# Patient Record
Sex: Female | Born: 1935 | Race: Black or African American | Hispanic: No
Health system: Southern US, Community
[De-identification: ages and names within clinical notes are randomized; demographics above are authoritative.]

---

## 2011-01-31 ENCOUNTER — Inpatient Hospital Stay: Payer: Self-pay | Admitting: Specialist

## 2011-02-13 ENCOUNTER — Ambulatory Visit: Payer: Self-pay | Admitting: Gastroenterology

## 2011-02-14 LAB — PATHOLOGY REPORT

## 2011-11-19 ENCOUNTER — Ambulatory Visit: Payer: Self-pay | Admitting: Rheumatology

## 2012-06-28 ENCOUNTER — Inpatient Hospital Stay: Payer: Self-pay | Admitting: Surgery

## 2012-06-28 LAB — CBC WITH DIFFERENTIAL/PLATELET
Basophil %: 0.2 %
Eosinophil #: 0 10*3/uL (ref 0.0–0.7)
HCT: 39.8 % (ref 35.0–47.0)
Lymphocyte #: 0.9 10*3/uL — ABNORMAL LOW (ref 1.0–3.6)
Lymphocyte %: 8.7 %
MCH: 29.2 pg (ref 26.0–34.0)
MCHC: 32.6 g/dL (ref 32.0–36.0)
Monocyte #: 0.5 x10 3/mm (ref 0.2–0.9)
Monocyte %: 4.5 %
Neutrophil %: 86.5 %
Platelet: 224 10*3/uL (ref 150–440)
RDW: 16.2 % — ABNORMAL HIGH (ref 11.5–14.5)
WBC: 10.3 10*3/uL (ref 3.6–11.0)

## 2012-06-28 LAB — COMPREHENSIVE METABOLIC PANEL
Albumin: 4.1 g/dL (ref 3.4–5.0)
Alkaline Phosphatase: 68 U/L (ref 50–136)
Anion Gap: 13 (ref 7–16)
BUN: 14 mg/dL (ref 7–18)
Bilirubin,Total: 1.1 mg/dL — ABNORMAL HIGH (ref 0.2–1.0)
Chloride: 97 mmol/L — ABNORMAL LOW (ref 98–107)
Creatinine: 0.99 mg/dL (ref 0.60–1.30)
EGFR (Non-African Amer.): 54 — ABNORMAL LOW
Glucose: 142 mg/dL — ABNORMAL HIGH (ref 65–99)
Potassium: 3.3 mmol/L — ABNORMAL LOW (ref 3.5–5.1)
SGOT(AST): 29 U/L (ref 15–37)
SGPT (ALT): 26 U/L
Total Protein: 8.4 g/dL — ABNORMAL HIGH (ref 6.4–8.2)

## 2012-06-28 LAB — URINALYSIS, COMPLETE
Glucose,UR: NEGATIVE mg/dL (ref 0–75)
Nitrite: NEGATIVE
Ph: 7 (ref 4.5–8.0)
Specific Gravity: 1.031 (ref 1.003–1.030)
Squamous Epithelial: NONE SEEN

## 2012-06-28 LAB — LIPASE, BLOOD: Lipase: 92 U/L (ref 73–393)

## 2012-06-29 LAB — COMPREHENSIVE METABOLIC PANEL
Albumin: 3.9 g/dL (ref 3.4–5.0)
Alkaline Phosphatase: 71 U/L (ref 50–136)
Anion Gap: 11 (ref 7–16)
BUN: 12 mg/dL (ref 7–18)
Calcium, Total: 9.3 mg/dL (ref 8.5–10.1)
Creatinine: 0.86 mg/dL (ref 0.60–1.30)
Glucose: 138 mg/dL — ABNORMAL HIGH (ref 65–99)
Potassium: 3.3 mmol/L — ABNORMAL LOW (ref 3.5–5.1)
SGOT(AST): 29 U/L (ref 15–37)
Total Protein: 8.4 g/dL — ABNORMAL HIGH (ref 6.4–8.2)

## 2012-06-29 LAB — CBC WITH DIFFERENTIAL/PLATELET
Basophil #: 0 10*3/uL (ref 0.0–0.1)
Basophil %: 0.3 %
Eosinophil #: 0 10*3/uL (ref 0.0–0.7)
Lymphocyte #: 1.4 10*3/uL (ref 1.0–3.6)
Lymphocyte %: 10.2 %
MCHC: 32.7 g/dL (ref 32.0–36.0)
MCV: 90 fL (ref 80–100)
Monocyte #: 0.7 x10 3/mm (ref 0.2–0.9)
Neutrophil #: 11.7 10*3/uL — ABNORMAL HIGH (ref 1.4–6.5)
Neutrophil %: 83.9 %
Platelet: 230 10*3/uL (ref 150–440)
RBC: 4.65 10*6/uL (ref 3.80–5.20)
RDW: 16.3 % — ABNORMAL HIGH (ref 11.5–14.5)
WBC: 13.9 10*3/uL — ABNORMAL HIGH (ref 3.6–11.0)

## 2012-06-29 LAB — TROPONIN I
Troponin-I: <0.02 ng/mL
Troponin-I: <0.02 ng/mL

## 2012-06-29 LAB — CK TOTAL AND CKMB (NOT AT ARMC)
CK, Total: 99 U/L
CK, Total: 76 U/L (ref 21–215)
CK, Total: 52 U/L (ref 21–215)
CK-MB: 0.9 ng/mL
CK-MB: 3.3 ng/mL (ref 0.5–3.6)

## 2012-06-30 LAB — CBC WITH DIFFERENTIAL/PLATELET
HCT: 39.6 % (ref 35.0–47.0)
Lymphocytes: 13 %
MCH: 30 pg (ref 26.0–34.0)
Monocytes: 8 %
Platelet: 233 10*3/uL (ref 150–440)
RBC: 4.4 10*6/uL (ref 3.80–5.20)
Segmented Neutrophils: 79 %
WBC: 13.2 10*3/uL — ABNORMAL HIGH (ref 3.6–11.0)

## 2012-06-30 LAB — COMPREHENSIVE METABOLIC PANEL
Albumin: 3.1 g/dL — ABNORMAL LOW (ref 3.4–5.0)
Alkaline Phosphatase: 63 U/L (ref 50–136)
BUN: 15 mg/dL (ref 7–18)
Bilirubin,Total: 1 mg/dL (ref 0.2–1.0)
Co2: 27 mmol/L (ref 21–32)
Creatinine: 0.78 mg/dL (ref 0.60–1.30)
EGFR (Non-African Amer.): >60
Osmolality: 278 (ref 275–301)
SGOT(AST): 20 U/L (ref 15–37)
SGPT (ALT): 16 U/L
Sodium: 138 mmol/L (ref 136–145)
Total Protein: 6.9 g/dL (ref 6.4–8.2)

## 2012-06-30 LAB — LIPASE, BLOOD: Lipase: 67 U/L — ABNORMAL LOW (ref 73–393)

## 2012-06-30 LAB — APTT: Activated PTT: 30.7 secs (ref 23.6–35.9)

## 2012-07-01 LAB — BASIC METABOLIC PANEL
Anion Gap: 12 (ref 7–16)
BUN: 13 mg/dL (ref 7–18)
Chloride: 102 mmol/L (ref 98–107)
Co2: 24 mmol/L (ref 21–32)
Creatinine: 0.6 mg/dL (ref 0.60–1.30)
EGFR (African American): >60
Osmolality: 276 (ref 275–301)

## 2012-07-01 LAB — CBC WITH DIFFERENTIAL/PLATELET
Comment - H1-Com1: NORMAL
Comment - H1-Com2: NORMAL
HCT: 38.1 % (ref 35.0–47.0)
HGB: 12.3 g/dL (ref 12.0–16.0)
Lymphocytes: 26 %
MCH: 29.4 pg (ref 26.0–34.0)
MCHC: 32.4 g/dL (ref 32.0–36.0)
MCV: 91 fL (ref 80–100)
Monocytes: 13 %
RDW: 15.9 % — ABNORMAL HIGH (ref 11.5–14.5)

## 2012-07-02 LAB — CBC WITH DIFFERENTIAL/PLATELET
Basophil #: 0 10*3/uL (ref 0.0–0.1)
Comment - H1-Com1: NORMAL
Comment - H1-Com2: NORMAL
Eosinophil #: 0 10*3/uL (ref 0.0–0.7)
Eosinophil %: 0.1 %
Lymphocyte #: 0.6 10*3/uL — ABNORMAL LOW (ref 1.0–3.6)
Lymphocytes: 4 %
MCH: 29.6 pg (ref 26.0–34.0)
MCHC: 32.9 g/dL (ref 32.0–36.0)
MCV: 90 fL (ref 80–100)
Monocyte #: 0.7 x10 3/mm (ref 0.2–0.9)
Monocyte %: 4.6 %
Neutrophil %: 90.9 %
Platelet: 219 10*3/uL (ref 150–440)
RBC: 4.49 10*6/uL (ref 3.80–5.20)
RDW: 16.4 % — ABNORMAL HIGH (ref 11.5–14.5)
Segmented Neutrophils: 89 %
Variant Lymphocyte - H1-Rlymph: 2 %

## 2012-07-02 LAB — BASIC METABOLIC PANEL
BUN: 17 mg/dL (ref 7–18)
Calcium, Total: 9.3 mg/dL (ref 8.5–10.1)
Co2: 23 mmol/L (ref 21–32)
Creatinine: 0.58 mg/dL — ABNORMAL LOW (ref 0.60–1.30)
EGFR (African American): >60
Glucose: 99 mg/dL (ref 65–99)
Potassium: 3.7 mmol/L (ref 3.5–5.1)
Sodium: 140 mmol/L (ref 136–145)

## 2012-07-03 LAB — CBC WITH DIFFERENTIAL/PLATELET
Basophil #: 0 10*3/uL (ref 0.0–0.1)
Basophil %: 0.1 %
Eosinophil #: 0 10*3/uL (ref 0.0–0.7)
Eosinophil %: 0.1 %
HCT: 40 % (ref 35.0–47.0)
HGB: 13.2 g/dL (ref 12.0–16.0)
Lymphocyte #: 0.8 10*3/uL — ABNORMAL LOW (ref 1.0–3.6)
Lymphocyte %: 4 %
MCH: 29.8 pg (ref 26.0–34.0)
MCHC: 32.9 g/dL (ref 32.0–36.0)
MCV: 90 fL (ref 80–100)
Monocyte #: 1.6 x10 3/mm — ABNORMAL HIGH (ref 0.2–0.9)
Neutrophil #: 17.4 10*3/uL — ABNORMAL HIGH (ref 1.4–6.5)
Platelet: 258 10*3/uL (ref 150–440)
WBC: 19.8 10*3/uL — ABNORMAL HIGH (ref 3.6–11.0)

## 2012-07-03 LAB — COMPREHENSIVE METABOLIC PANEL
Albumin: 3.1 g/dL — ABNORMAL LOW (ref 3.4–5.0)
Anion Gap: 16 (ref 7–16)
BUN: 18 mg/dL (ref 7–18)
Chloride: 107 mmol/L (ref 98–107)
Co2: 19 mmol/L — ABNORMAL LOW (ref 21–32)
EGFR (African American): >60
EGFR (Non-African Amer.): >60
Glucose: 112 mg/dL — ABNORMAL HIGH (ref 65–99)
Osmolality: 286 (ref 275–301)
SGOT(AST): 14 U/L — ABNORMAL LOW (ref 15–37)
Total Protein: 6.6 g/dL (ref 6.4–8.2)

## 2012-07-04 LAB — CBC WITH DIFFERENTIAL/PLATELET
HCT: 38.8 % (ref 35.0–47.0)
HGB: 12.5 g/dL (ref 12.0–16.0)
MCH: 29.6 pg (ref 26.0–34.0)
MCHC: 32.3 g/dL (ref 32.0–36.0)
MCV: 92 fL (ref 80–100)
RDW: 17.3 % — ABNORMAL HIGH (ref 11.5–14.5)
Segmented Neutrophils: 65 %

## 2012-07-04 LAB — BASIC METABOLIC PANEL
Anion Gap: 7 (ref 7–16)
BUN: 13 mg/dL (ref 7–18)
Calcium, Total: 8.1 mg/dL — ABNORMAL LOW (ref 8.5–10.1)
Creatinine: 0.81 mg/dL (ref 0.60–1.30)
EGFR (African American): >60
Glucose: 181 mg/dL — ABNORMAL HIGH (ref 65–99)
Osmolality: 292 (ref 275–301)
Potassium: 3.5 mmol/L (ref 3.5–5.1)
Sodium: 144 mmol/L (ref 136–145)

## 2012-07-06 LAB — CBC WITH DIFFERENTIAL/PLATELET
Basophil #: 0 10*3/uL (ref 0.0–0.1)
Eosinophil #: 0.1 10*3/uL (ref 0.0–0.7)
Eosinophil %: 0.3 %
Lymphocyte #: 2.5 10*3/uL (ref 1.0–3.6)
MCHC: 31 g/dL — ABNORMAL LOW (ref 32.0–36.0)
MCV: 90 fL (ref 80–100)
Monocyte #: 1.2 x10 3/mm — ABNORMAL HIGH (ref 0.2–0.9)
Monocyte %: 7 %
Neutrophil #: 13.5 10*3/uL — ABNORMAL HIGH (ref 1.4–6.5)
Neutrophil %: 78.2 %
RBC: 3.66 10*6/uL — ABNORMAL LOW (ref 3.80–5.20)
RDW: 15.7 % — ABNORMAL HIGH (ref 11.5–14.5)

## 2012-07-06 LAB — BASIC METABOLIC PANEL
Calcium, Total: 8.2 mg/dL — ABNORMAL LOW (ref 8.5–10.1)
Chloride: 106 mmol/L (ref 98–107)
Co2: 26 mmol/L (ref 21–32)
Creatinine: 0.66 mg/dL (ref 0.60–1.30)
EGFR (African American): >60
EGFR (Non-African Amer.): >60
Potassium: 3 mmol/L — ABNORMAL LOW (ref 3.5–5.1)
Sodium: 144 mmol/L (ref 136–145)

## 2012-07-07 LAB — BASIC METABOLIC PANEL
Anion Gap: 7 (ref 7–16)
BUN: 5 mg/dL — ABNORMAL LOW (ref 7–18)
Chloride: 103 mmol/L (ref 98–107)
Co2: 28 mmol/L (ref 21–32)
EGFR (African American): >60
Glucose: 165 mg/dL — ABNORMAL HIGH (ref 65–99)
Osmolality: 277 (ref 275–301)
Potassium: 3.6 mmol/L (ref 3.5–5.1)

## 2012-07-07 LAB — CBC WITH DIFFERENTIAL/PLATELET
Basophil #: 0.1 10*3/uL (ref 0.0–0.1)
Basophil %: 0.5 %
Eosinophil #: 0.1 10*3/uL (ref 0.0–0.7)
HCT: 35.2 % (ref 35.0–47.0)
HGB: 11.5 g/dL — ABNORMAL LOW (ref 12.0–16.0)
Lymphocyte #: 2.5 10*3/uL (ref 1.0–3.6)
MCH: 29.2 pg (ref 26.0–34.0)
MCHC: 32.7 g/dL (ref 32.0–36.0)
MCV: 90 fL (ref 80–100)
Monocyte %: 8.7 %
Neutrophil #: 11.4 10*3/uL — ABNORMAL HIGH (ref 1.4–6.5)
RBC: 3.93 10*6/uL (ref 3.80–5.20)
RDW: 15.9 % — ABNORMAL HIGH (ref 11.5–14.5)
WBC: 15.5 10*3/uL — ABNORMAL HIGH (ref 3.6–11.0)

## 2012-07-07 LAB — POTASSIUM: Potassium: 3 mmol/L — ABNORMAL LOW (ref 3.5–5.1)

## 2012-07-08 LAB — CBC WITH DIFFERENTIAL/PLATELET
Basophil %: 0.7 %
Eosinophil %: 0.8 %
HCT: 34 % — ABNORMAL LOW (ref 35.0–47.0)
Lymphocyte #: 2.7 10*3/uL (ref 1.0–3.6)
Lymphocyte %: 18 %
MCHC: 32.6 g/dL (ref 32.0–36.0)
MCV: 90 fL (ref 80–100)
Monocyte #: 1.6 x10 3/mm — ABNORMAL HIGH (ref 0.2–0.9)
Monocyte %: 10.4 %
Neutrophil #: 10.7 10*3/uL — ABNORMAL HIGH (ref 1.4–6.5)
Platelet: 251 10*3/uL (ref 150–440)
RBC: 3.77 10*6/uL — ABNORMAL LOW (ref 3.80–5.20)
WBC: 15.2 10*3/uL — ABNORMAL HIGH (ref 3.6–11.0)

## 2012-07-08 LAB — BASIC METABOLIC PANEL
BUN: 4 mg/dL — ABNORMAL LOW (ref 7–18)
Calcium, Total: 8.5 mg/dL (ref 8.5–10.1)
Co2: 27 mmol/L (ref 21–32)
EGFR (African American): >60
EGFR (Non-African Amer.): >60
Glucose: 113 mg/dL — ABNORMAL HIGH (ref 65–99)
Osmolality: 277 (ref 275–301)
Potassium: 3.2 mmol/L — ABNORMAL LOW (ref 3.5–5.1)

## 2012-07-08 LAB — PATHOLOGY REPORT

## 2012-07-09 LAB — BASIC METABOLIC PANEL
Anion Gap: 8 (ref 7–16)
BUN: 2 mg/dL — ABNORMAL LOW (ref 7–18)
Calcium, Total: 8.7 mg/dL (ref 8.5–10.1)
Chloride: 105 mmol/L (ref 98–107)
EGFR (Non-African Amer.): >60
Glucose: 118 mg/dL — ABNORMAL HIGH (ref 65–99)
Osmolality: 275 (ref 275–301)
Potassium: 3.8 mmol/L (ref 3.5–5.1)
Sodium: 139 mmol/L (ref 136–145)

## 2012-07-09 LAB — CBC WITH DIFFERENTIAL/PLATELET
Eosinophil %: 1 %
HCT: 32.5 % — ABNORMAL LOW (ref 35.0–47.0)
Lymphocyte #: 3.2 10*3/uL (ref 1.0–3.6)
MCH: 28.7 pg (ref 26.0–34.0)
MCHC: 31.8 g/dL — ABNORMAL LOW (ref 32.0–36.0)
MCV: 91 fL (ref 80–100)
Monocyte #: 1.4 x10 3/mm — ABNORMAL HIGH (ref 0.2–0.9)
Monocyte %: 9 %
Neutrophil #: 10.6 10*3/uL — ABNORMAL HIGH (ref 1.4–6.5)
Platelet: 276 10*3/uL (ref 150–440)
RBC: 3.59 10*6/uL — ABNORMAL LOW (ref 3.80–5.20)

## 2012-07-23 ENCOUNTER — Inpatient Hospital Stay: Payer: Self-pay | Admitting: Family Medicine

## 2012-07-23 LAB — CBC WITH DIFFERENTIAL/PLATELET
Eosinophil #: 0 10*3/uL (ref 0.0–0.7)
Eosinophil %: 0.4 %
HGB: 10.9 g/dL — ABNORMAL LOW (ref 12.0–16.0)
Lymphocyte #: 1 10*3/uL (ref 1.0–3.6)
Lymphocyte %: 13.9 %
MCH: 30.4 pg (ref 26.0–34.0)
MCV: 91 fL (ref 80–100)
Monocyte #: 0.6 x10 3/mm (ref 0.2–0.9)
Monocyte %: 9.3 %
Neutrophil %: 76 %
Platelet: 205 10*3/uL (ref 150–440)
RBC: 3.6 10*6/uL — ABNORMAL LOW (ref 3.80–5.20)
RDW: 16.7 % — ABNORMAL HIGH (ref 11.5–14.5)
WBC: 6.9 10*3/uL (ref 3.6–11.0)

## 2012-07-23 LAB — URINALYSIS, COMPLETE
Bacteria: NONE SEEN
Bilirubin,UR: NEGATIVE
Glucose,UR: NEGATIVE mg/dL (ref 0–75)
Leukocyte Esterase: NEGATIVE
Nitrite: NEGATIVE
RBC,UR: 1 /HPF (ref 0–5)
Specific Gravity: 1.018 (ref 1.003–1.030)
Transitional Epi: <1
WBC UR: 1 /HPF (ref 0–5)

## 2012-07-23 LAB — COMPREHENSIVE METABOLIC PANEL
Alkaline Phosphatase: 58 U/L (ref 50–136)
Calcium, Total: 8.8 mg/dL (ref 8.5–10.1)
Chloride: 108 mmol/L — ABNORMAL HIGH (ref 98–107)
Co2: 20 mmol/L — ABNORMAL LOW (ref 21–32)
Creatinine: 1.05 mg/dL (ref 0.60–1.30)
EGFR (African American): 58 — ABNORMAL LOW
EGFR (Non-African Amer.): 50 — ABNORMAL LOW
Glucose: 125 mg/dL — ABNORMAL HIGH (ref 65–99)
Osmolality: 281 (ref 275–301)
SGOT(AST): 17 U/L (ref 15–37)
SGPT (ALT): 11 U/L — ABNORMAL LOW (ref 12–78)
Sodium: 138 mmol/L (ref 136–145)

## 2012-07-23 LAB — TROPONIN I: Troponin-I: <0.02 ng/mL

## 2012-07-24 LAB — CBC WITH DIFFERENTIAL/PLATELET
Basophil #: 0.1 10*3/uL (ref 0.0–0.1)
Basophil %: 0.6 %
Eosinophil #: 0.1 10*3/uL (ref 0.0–0.7)
Eosinophil %: 0.8 %
HCT: 29.9 % — ABNORMAL LOW (ref 35.0–47.0)
Lymphocyte %: 24.9 %
MCHC: 34.8 g/dL (ref 32.0–36.0)
Monocyte %: 10.9 %
Neutrophil #: 5.6 10*3/uL (ref 1.4–6.5)

## 2012-07-24 LAB — BASIC METABOLIC PANEL
Calcium, Total: 8.6 mg/dL (ref 8.5–10.1)
Co2: 20 mmol/L — ABNORMAL LOW (ref 21–32)
EGFR (African American): >60
Osmolality: 276 (ref 275–301)
Potassium: 3.5 mmol/L (ref 3.5–5.1)
Sodium: 139 mmol/L (ref 136–145)

## 2012-07-25 LAB — CBC WITH DIFFERENTIAL/PLATELET
Basophil %: 0.6 %
Eosinophil #: 0.1 10*3/uL (ref 0.0–0.7)
Eosinophil %: 1.9 %
HCT: 29.1 % — ABNORMAL LOW (ref 35.0–47.0)
HGB: 9.8 g/dL — ABNORMAL LOW (ref 12.0–16.0)
Lymphocyte #: 2 10*3/uL (ref 1.0–3.6)
Lymphocyte %: 35.6 %
MCH: 31.1 pg (ref 26.0–34.0)
MCHC: 33.9 g/dL (ref 32.0–36.0)
MCV: 92 fL (ref 80–100)
Monocyte #: 0.5 x10 3/mm (ref 0.2–0.9)
Neutrophil #: 3.1 10*3/uL (ref 1.4–6.5)
RBC: 3.16 10*6/uL — ABNORMAL LOW (ref 3.80–5.20)
WBC: 5.7 10*3/uL (ref 3.6–11.0)

## 2012-07-25 LAB — BASIC METABOLIC PANEL
Calcium, Total: 8.5 mg/dL (ref 8.5–10.1)
Chloride: 112 mmol/L — ABNORMAL HIGH (ref 98–107)
Co2: 21 mmol/L (ref 21–32)
Osmolality: 281 (ref 275–301)
Potassium: 3.1 mmol/L — ABNORMAL LOW (ref 3.5–5.1)

## 2012-07-29 LAB — CULTURE, BLOOD (SINGLE)

## 2013-05-09 IMAGING — CR DG ABDOMEN 2V
1 series · 4 of 4 positions shown · non-contrast
Comparison: none

REASON FOR EXAM: f/u distal psbo vs constipation
COMMENTS:

PROCEDURE:     DXR - DXR ABDOMEN 2 V FLAT AND ERECT  - July 02, 2012  [DATE]
RESULT:     Comparisons:  06/30/2012

[Series 1: x abdomen supine · 0.14mm/px · 4 of 4 slices shown]
[im 1/4]
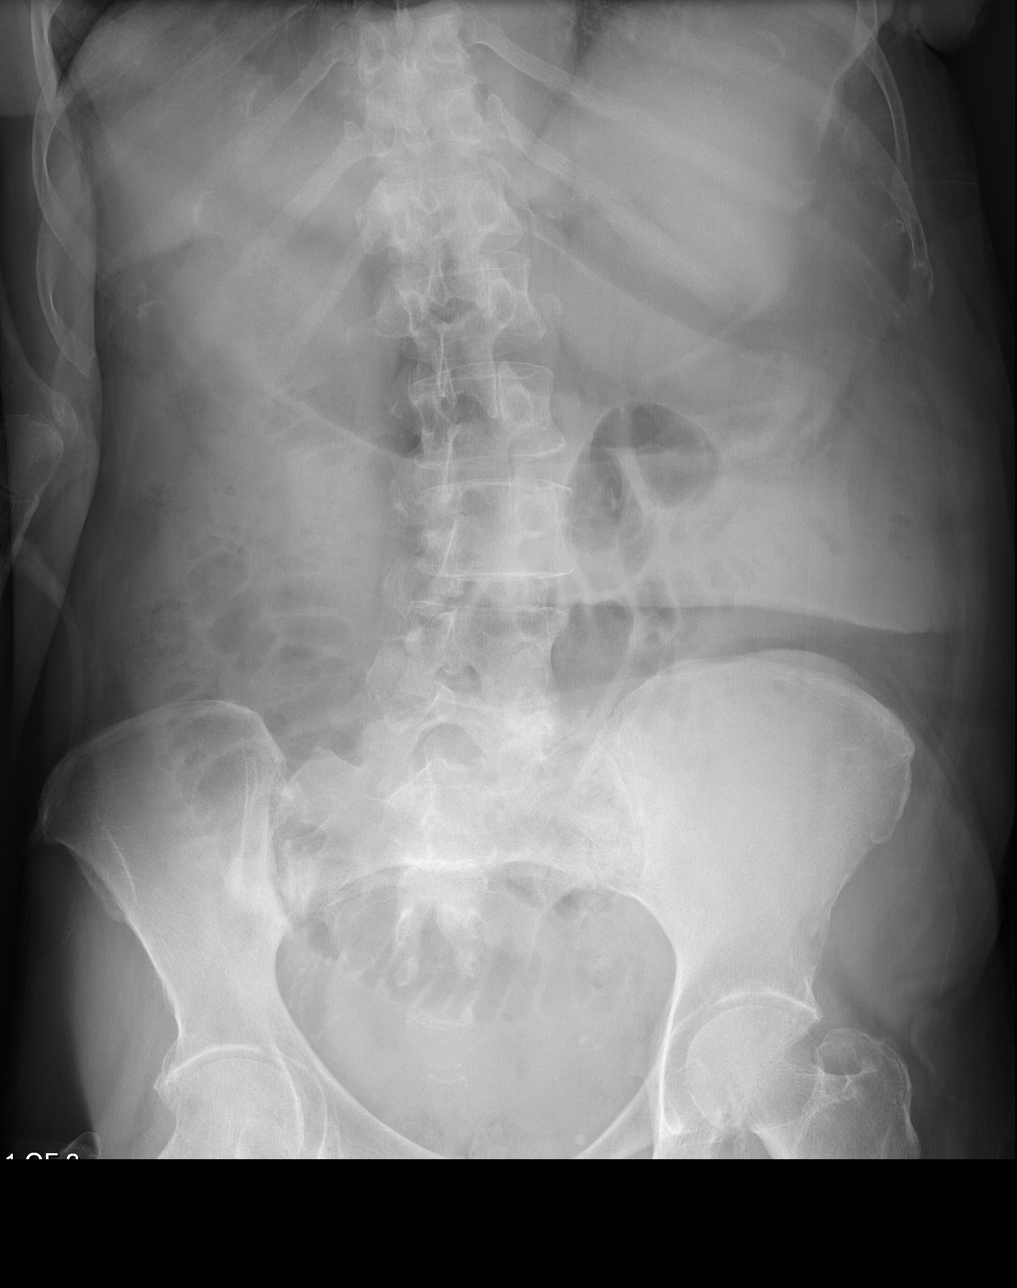
[im 2/4]
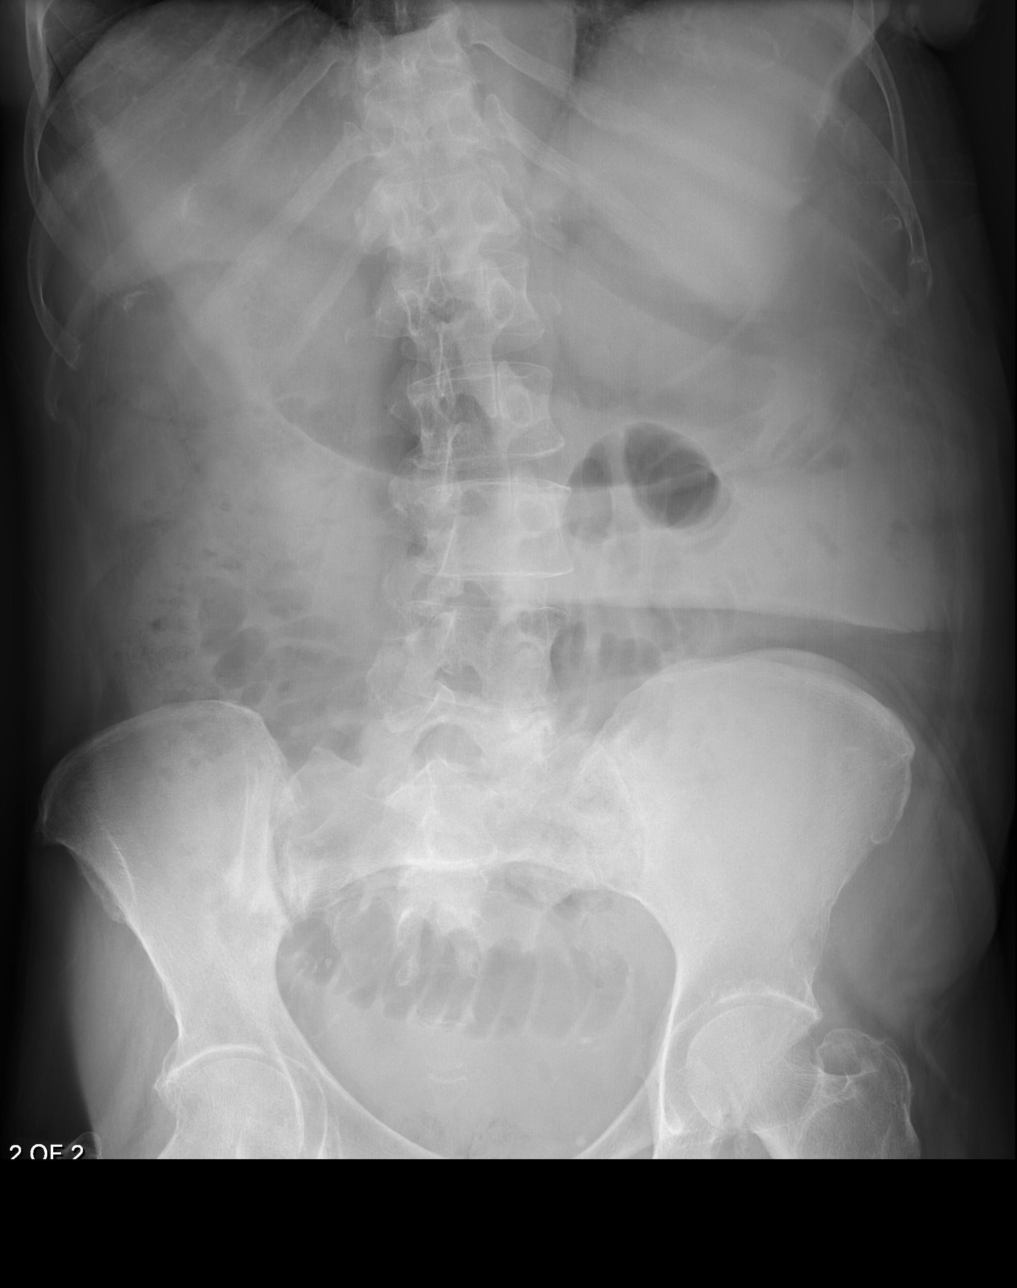
[im 3/4]
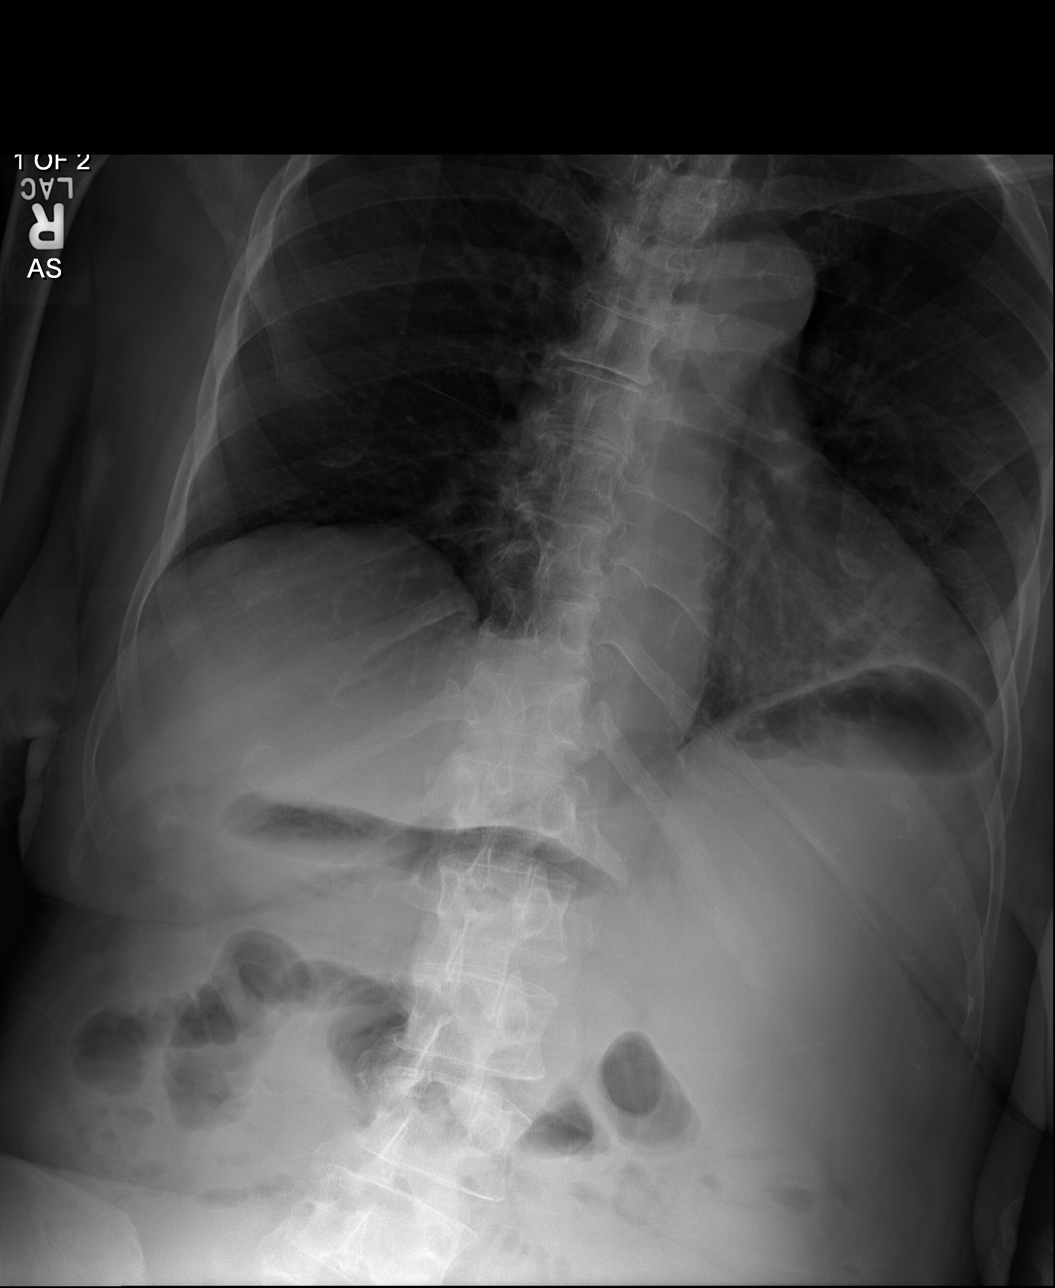
[im 4/4]
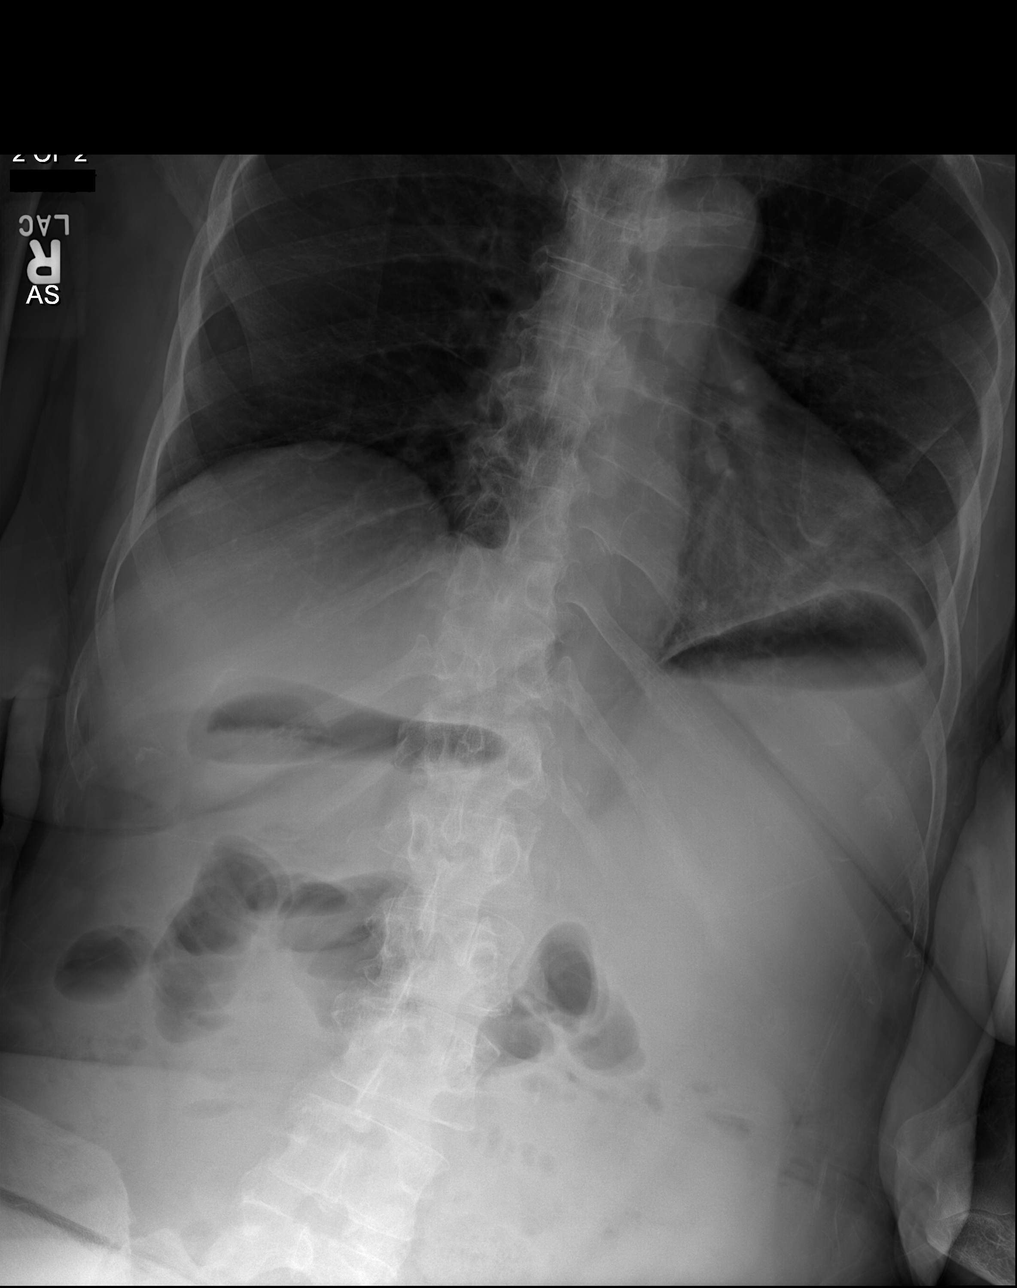

[4 of 4 positions shown; findings below may reference images not displayed]

FINDINGS: Supine and upright views of the abdomen are provided.

There is a nonspecific bowel gas pattern. There is no bowel dilatation to
suggest obstruction. There are a few small bowel air-fluid levels. There is
a gastric air fluid level. There is air seen within the colon. There is no
pathologic calcification along the expected course of the ureters. There is
no evidence of pneumoperitoneum, portal venous gas, or pneumatosis.

The osseous structures are unremarkable.
IMPRESSION: Findings which may reflect partial small bowel obstruction versus ileus.

[REDACTED]

## 2013-05-14 IMAGING — XA IR VASCULAR PROCEDURE
1 series · 1 of 1 positions shown · non-contrast
Comparison: none

[Series 1: single · 1 of 1 slices shown]
[im 1/1]
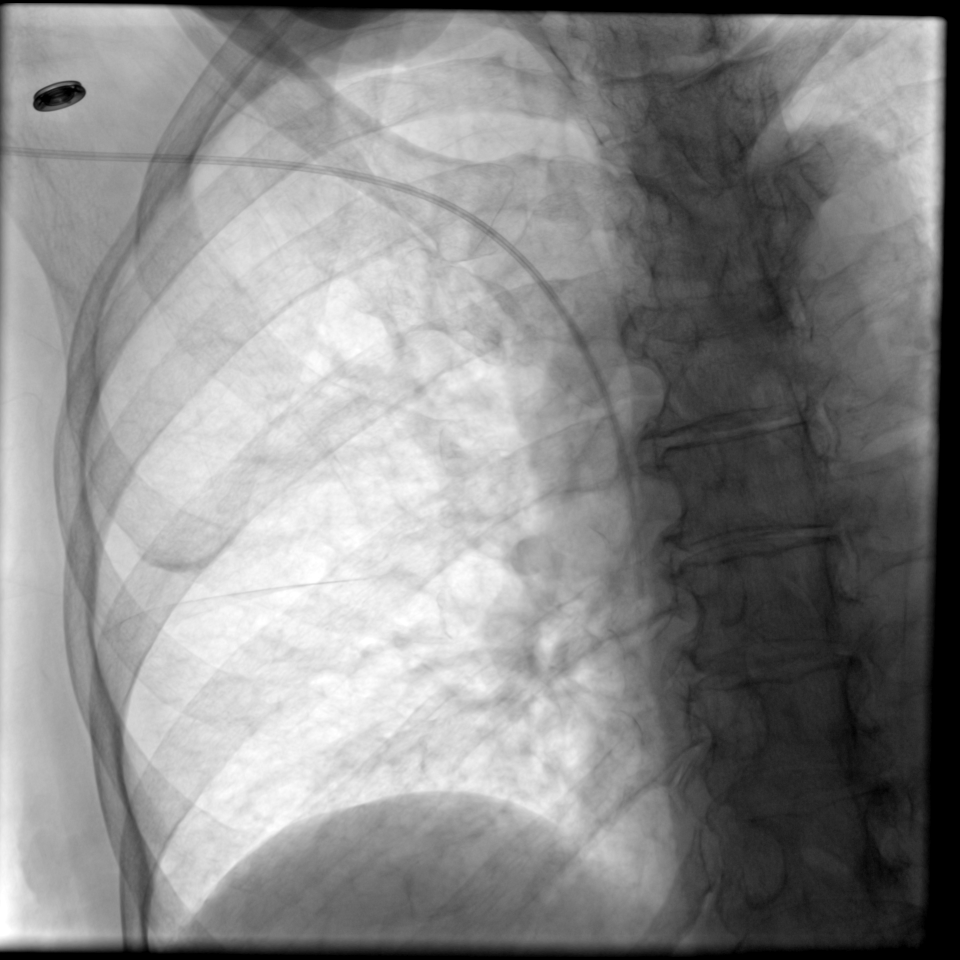

[1 of 1 positions shown; findings below may reference images not displayed]

IMAGES IMPORTED FROM THE [employer] WORKFLOW SYSTEM
NO DICTATION FOR STUDY

## 2015-04-01 ENCOUNTER — Other Ambulatory Visit: Payer: Self-pay | Admitting: Geriatric Medicine

## 2015-04-01 DIAGNOSIS — Z1231 Encounter for screening mammogram for malignant neoplasm of breast: Secondary | ICD-10-CM

## 2015-04-05 NOTE — Discharge Summary (Signed)
PATIENT NAME:  Sherri Stevens, Sherri Stevens MR#:  [id number] DATE OF BIRTH:  Feb 23, 1930  DATE OF ADMISSION:  07/23/2012 DATE OF DISCHARGE:  07/25/2012   REASON FOR ADMISSION: Abdominal pain, constipation.   DISCHARGE DIAGNOSIS: Obstipation due to narcotics.  OTHER DIAGNOSES:  1. Hypertension. 2. Diabetes. 3. Prior history of diverticulosis.   4. Seizure disorder.  5. Gastroesophageal reflux disease.   6. Rheumatoid arthritis.   FOLLOW-UP: Follow-up with primary care physician, Dr. Jorene Guestomstock, and Dr. Juliann PulseLundquist or Dr. Anda KraftMarterre for surgery in 1 to 2 weeks.   HOSPITAL COURSE: [name] is a very nice 81 year old female who has history of hypertension and rheumatoid arthritis. She is status post abdominal surgery with recent bowel resection. The patient has been doing okay but with occasional pain. She had a CT scan showing evidence of fecal impaction. The patient was admitted with concern of diverticulitis. She was put on antibiotics that were stopped. We didn't feel like she had diverticulitis. The case was discussed with Dr. Juliann PulseLundquist from Surgery who felt the same way. The patient was given enemas and her symptoms resolved completely by the time of discharge. I spent 35 minutes with this discharge speaking with the family about the plan on how to control her nausea, pain, and constipation. We also talked about the use of Norco. She needs to avoided it in order to prevent the constipation to get worse. The patient is going to be discharged on home health.  MEDICATIONS AT DISCHARGE:  1. Norco. The patient is warned not to take it unless pain is severe.   2. Atorvastatin 10 mg daily. 3. Cholecalciferol daily. 4. Colace has been changed to senna and is going to be given twice daily. 5. Folic acid one twice daily. 6. Hydrochlorothiazide 12.5 mg once daily. 7. Keppra 500 mg twice daily. 8. Metformin 500 mg twice daily. 9. Methotrexate 2.5 mg tablets 8 tablets twice weekly. 10. Norvasc 5 mg  daily. 11. Omeprazole 20 mg daily. 12. Quinapril 20 mg daily. 13. Prednisone 5 mg daily. 14. KCL 10 mEq once daily.   The patient is to be discharged with home health in good condition and to follow-up with the physicians mentioned above.   TIME SPENT ON THIS DISCHARGE: 35 minutes.   ____________________________ Felipa Furnaceoberto Sanchez Gutierrez, MD rsg:drc D: 07/25/2012 12:33:19 ET T: 07/25/2012 12:51:25 ET JOB#: [id number]  cc: Felipa Furnaceoberto Sanchez Gutierrez, MD, <Dictator> Abbey ChattersLloyd K. Jorene Guestomstock, MD Regan RakersOBERTO Juanda ChanceSANCHEZ GUTIERRE MD ELECTRONICALLY SIGNED 08/02/2012 13:19

## 2015-04-05 NOTE — Op Note (Signed)
PATIENT NAME:  Sherri Stevens, Clelia V MR#:  [id number] DATE OF BIRTH:  05/08/1930  DATE OF PROCEDURE:  07/07/2012  PREOPERATIVE DIAGNOSES:  1. Small bowel obstruction.  2. Aspiration pneumonia.  3. Poor venous access and need for IV antibiotics and fluid.   POSTOPERATIVE DIAGNOSES:  1. Small bowel obstruction.  2. Aspiration pneumonia.  3. Poor venous access and need for IV antibiotics and fluid.   PROCEDURES:  1. Ultrasound guidance for vascular access to right brachial vein.  2. Fluoroscopic guidance for placement of catheter.  3. Insertion of peripherally inserted dual lumen, central venous catheter, right arm.  SURGEON: Annice NeedyJason S. Eliyahu Bille, MD  ANESTHESIA: Local.   ESTIMATED BLOOD LOSS: Minimal.   INDICATION FOR PROCEDURE: 81 year old white female with small bowel obstruction, postoperative pneumonia. She has poor venous access and a PICC line is requested.   DESCRIPTION OF PROCEDURE: The patient's right arm was sterilely prepped and draped, and a sterile surgical field was created. The right basilic vein was accessed under direct ultrasound guidance without difficulty with a micropuncture needle and permanent image was recorded. 0.018 wire was then placed into the superior vena cava. Peel-away sheath was placed over the wire. A single lumen peripherally inserted central venous catheter was then placed over the wire and the wire and peel-away sheath were removed. The catheter tip was placed into the superior vena cava and was secured at the skin at 34 cm with a sterile dressing. The catheter withdrew blood well and flushed easily with heparinized saline.    The patient tolerated procedure well.  ____________________________ Annice NeedyJason S. Thien Berka, MD jsd:cms D: 07/07/2012 11:59:16 ET T: 07/07/2012 12:29:52 ET JOB#: [id number]  cc: Annice NeedyJason S. Sebastien Jackson, MD, <Dictator> Annice NeedyJASON S Priscille Shadduck MD ELECTRONICALLY SIGNED 07/07/2012 16:20

## 2015-04-05 NOTE — Op Note (Signed)
PATIENT NAME:  Sherri Stevens, Sherri Stevens MR#:  [id number] DATE OF BIRTH:  December 17, 1930  DATE OF PROCEDURE:  07/03/2012  PREOPERATIVE DIAGNOSIS: Small bowel obstruction and ventral hernias.   POSTOPERATIVE DIAGNOSIS: Adhesive small bowel obstruction with focal perforation and ventral hernias.   OPERATION PERFORMED:  1. Segmental ileectomy with primary anastomosis.  2. Small bowel suture repair.  3. Omentoplasty.  4. Ventral hernia repairs.  5. Enterolysis.   SURGEON: Claude Manges, MD   ANESTHESIA: General.   PROCEDURE IN DETAIL: The patient was placed supine on the Operating Room table and prepped and draped in the usual sterile fashion. The patient had two lower midline incisions. The one that was closest to the midline was reopened with a scalpel and electrocautery, and two hernia sacs were encountered and both were dissected free of surrounding subcutaneous tissue down to their fascial base. The fascia was then entered or opened, and the peritoneum was entered carefully, and both hernia sacs were completely excised. The upper hernia sac contained a loop of transverse colon that was nonobstructing (both by the admission CT scan as well as intraoperatively), and the inferior ventral hernia contained preperitoneal fat and omentum. An extensive enterolysis was undertaken in order to free up the omentum on both sides of the abdomen as it was apparent that the omentum had been previously split during one of the previous operations leaving the right third of it to the right side of the abdomen, and the left two thirds of it to the left side of the abdomen, and no omentum in the middle of the abdomen. Once all of the omental adhesions were taken down, inspection was undertaken of the small intestine; and the proximal small intestine was mildly dilated and injected but clearly viable. The wall was not particularly edematous. There was a single adhesive band that went from the small bowel mesentery to the  retroperitoneum, and this was lysed; and when it was lysed there was an obvious very focal perforation just under adhesion that had  not spilled any small bowel content until I lysed the adhesion. The contents were essentially bilious and had not fecalized, and spillage was minimal. I resected an inch or two of small bowel on either side of this where this adhesive band had been with two separate firings of the GIA stapling device and taking down the small bowel mesentery with the Harmonic scalpel. A primary anastomosis or side-to-side enteroenterostomy was then performed with the GIA stapling device on the antimesenteric surface of each limb of the intestine with the resultant enterotomy being closed with a TA-60 stapler. A 3-0 silk seromuscular Lembert suture was placed at the apex of the GIA staple line, and a 3-0 Monocryl running suture was used to close the mesenteric defect. The peritoneum was then irrigated with copious amounts of warm normal saline and the distal decompressed ileum was then carefully inspected to insure that there were no other adhesions that would cause a further obstruction;  and then the two chronically split leaves of omentum were sewn back to one another with a running 3-0 Monocryl suture so that after the small intestine was replaced in its anatomic position with the ileum in the right lower quadrant and the jejunum in the left upper quadrant the omentum could be draped over top of it to prevent any of the loops of intestine from adhering to the anterior abdominal wall. The ventral hernias were repaired with interrupted #1 Prolene sutures, and approximately three of these were placed, and  then the linea alba was closed with a running #1 PDS suture. The subcutaneous tissue was irrigated with warm normal saline and all suctioned clear, and the skin was reapproximated with a skin stapling device. It should be mentioned that there was a single cautery injury to the small intestine, and  this was repaired with 3-0 silk seromuscular Lembert sutures just after the injury occurred. A sterile dressing was applied completing the procedure, and the patient tolerated the procedure well and there were no complications.    ____________________________ Claude MangesWilliam F. Yasmina Chico, MD wfm:cbb D: 07/03/2012 15:04:14 ET T: 07/03/2012 15:53:21 ET JOB#: [id number]  cc: Claude MangesWilliam F. Sherri Blouch, MD, <Dictator> Claude MangesWILLIAM F Keiden Deskin MD ELECTRONICALLY SIGNED 07/12/2012 7:08

## 2015-04-05 NOTE — Discharge Summary (Signed)
PATIENT NAME:  [name], [name] MR#:  [id number] DATE OF BIRTH:  April 05, 1930  DATE OF ADMISSION:  06/28/2012 DATE OF DISCHARGE:  07/09/2012  PRINCIPLE DIAGNOSIS: Adhesive small bowel obstruction with focal perforation.   OTHER DIAGNOSES:  1. Ventral hernia.  2. Diabetes mellitus. 3. Rheumatoid arthritis. 4. Hypertension.  5. History of cerebrovascular accident.  6. Status post sigmoid colon resection for diverticulitis. 7. Status post total abdominal hysterectomy.   PRINCIPLE PROCEDURES PERFORMED DURING THIS ADMISSION: Segmental ileectomy with primary anastomosis, small bowel suture repair, omentoplasty, ventral hernia repairs, enterolysis 07/03/2012.   HOSPITAL COURSE: [name] was admitted to the hospital and underwent nasogastric suction, IV fluid hydration, and had multiple enemas and cathartics and had multiple bowel movements but despite that continued to have abdominal distention and after her nasogastric tube was removed she vomited some bile. She was tachycardic and, therefore, was given IV antibiotics and taken to the operating room where she underwent the above-mentioned procedure for the above-mentioned diagnoses. She remained with a nasogastric tube postoperatively until postoperative day three when she was transferred from the Intensive Care Unit to the floor and she had a PICC line placed on postoperative day four and her diet was advanced and her potassium was replaced for hypokalemia. Ultimately she was discharged home a couple of days later. She was asked to make an appointment to see me in the office and to call in the interim for any problems.   ____________________________ Claude MangesWilliam F. Tramar Brueckner, MD wfm:drc D: 07/30/2012 10:12:00 ET T: 07/31/2012 08:58:24 ET JOB#: [id number] cc: Claude MangesWilliam F. Tessia Kassin, MD, <Dictator> Claude MangesWILLIAM F Gethsemane Fischler MD ELECTRONICALLY SIGNED 08/01/2012 21:07

## 2015-04-05 NOTE — Consult Note (Signed)
PATIENT NAME:  Sherri Stevens, Sherri Stevens MR#:  [id number] DATE OF BIRTH:  11-24-1930  DATE OF CONSULTATION:  07/24/2012  REFERRING PHYSICIAN:   CONSULTING PHYSICIAN:  Trivia Heffelfinger A. Lawerence Dery, MD  REASON FOR CONSULTATION: One episode of abdominal pain and constipation.   HISTORY OF PRESENT ILLNESS: [name] is a pleasant 81 year old female who was recently admitted to our service in July and underwent a small bowel resection and hernia repair for incarcerated hernia. She was recently discharged. She presented yesterday with what sounds like a vagal episode. It sounds like she got an enema, was having a bowel movement, got lightheaded and became dusky and almost passed out.  She does come in with persistent abdominal pain and intermittent nausea, but the most concerning thing for her is that she has not had a bowel movement in a week. She is continuing to take pain medications for abdominal pain and has had two enemas, one last Wednesday, approximately 8 days ago, and one yesterday, which was mentioned earlier. She is kind of able to keep intermittent bowel movements. Nothing makes her pain better. It is mid abdomen and not radiating anywhere, it is kind of mid abdomen.  She denies any fevers, chills, night sweats, shortness of breath, nausea, vomiting, dysuria, or hematuria. Has had significant weight loss.  PAST MEDICAL HISTORY:  1. Small bowel resection for incarcerated hernia by Dr. Anda KraftMarterre on 07/03/2012. 2. History of total abdominal hysterectomy. 3. History of hypertension. 4. Rheumatoid arthritis for which she takes medicines. 5. Diabetes mellitus.  6. History of diverticulitis for which she, according to her daughter, had surgery  for previously.   DRUG ALLERGIES: Tramadol.  SOCIAL HISTORY:  Denies alcohol, smoking, or drugs. Has daughters one of which is here with her.   FAMILY HISTORY:  Noncontributory.   HOME MEDICATIONS: 1. Vicodin as needed for pain. 2. Atorvastatin 10 mg p.o.  daily. 3. Cholecalciferol 100 units daily. 4. Colace 100 mg p.o. twice a day. 5. Folate 1 mg twice a day. 6. Hydrochlorothiazide 12.5 mg p.o. daily. 7. Keppra 500 mg IV twice a day.  8. Metformin 500 mg p.o. twice a day. 9. Methotrexate 2.5 mg p.o. weekly. 10. Norvasc 5 mg daily. 11. Omeprazole 20 mg p.o. daily. 12. Quinapril 20 mg p.o. daily. 13. Prednisone 5 mg p.o. daily. 14. Potassium chloride 10 milliequivalents p.o. daily.  REVIEW OF SYSTEMS: As above. A 14-point review of systems was conducted and pertinent positive and negatives as above.  PHYSICAL EXAMINATION:  CURRENT VITALS: Temperature 99.2, pulse 91, blood pressure 89/54, respirations 18, saturation 100% on room air.   GENERAL: In no acute distress. Appears tired.  HEAD: Normocephalic, atraumatic.   NECK: No obvious masses. No swelling.  CHEST: Lungs clear to auscultation bilaterally.   HEART: Regular rate and rhythm.   ABDOMEN: Soft, nontender, and nondistended. Has a lower midline incision from hernia. No obvious abnormalities.   EXTREMITIES: Moves all extremities well. 5 out of 5 strength in all extremities.  ANUS: No obvious hemorrhoids, no obvious bleeding, and no palpable stool. Rectal vault is without stool.   LABS: White blood cell count 9.0, hemoglobin 10.4, hematocrit 29.9, and platelets 241.   Renal panel is unremarkable, except for potassium at 3.5. LFTs were normal on admission.   IMAGING: CT scan shows large amount of stool in the colon and rectum, but no obvious intraabdominal processes, no bowel obstruction, and no recurrent hernia.   ASSESSMENT AND PLAN: [name] is a pleasant 81 year old female with history of diverticulitis  as well as recent surgery for hernia and small bowel resection. She appears to be quite constipated. She has been started on a more aggressive bowel regimen including Colace, Lactulose, and senna. I will start performing enemas to see if we can get her constipation  better from below and make her feel better. I think she should be on a more aggressive bowel regimen at discharge. Will continue to follow.  ____________________________ Si Raider Charmayne Odell, MD cal:slb D: 07/24/2012 11:31:25 ET T: 07/24/2012 11:52:41 ET JOB#: [id number]  cc: Cristal Deer A. Sasuke Yaffe, MD, <Dictator> Jarvis Newcomer MD ELECTRONICALLY SIGNED 07/24/2012 17:09

## 2015-04-05 NOTE — H&P (Signed)
PATIENT NAME:  Sherri Stevens, Vestal V MR#:  [id number] DATE OF BIRTH:  1930/04/26  DATE OF ADMISSION:  07/23/2012  PRIMARY CARE PHYSICIAN: Dr. Reynolds BowlLloyd Comstock  ER PHYSICIAN: Dr. Darnelle CatalanMalinda  CHIEF COMPLAINT: Abdominal pain.   HISTORY OF PRESENT ILLNESS: Patient is an 81 year old female with history of hypertension, rheumatoid arthritis and also seizure disorder who is brought into the Emergency Room because patient having abdominal pain since this morning. Patient went to the bathroom around lunchtime and she passed out and the daughter called EMS. Patient's blood pressure was low in the Emergency Room at 98/60 and despite fluids. Patient was started on dopamine drip by ER physician. I was asked to admit patient for abdominal pain and possible diverticulitis. Patient's daughter says that patient was just discharged on 07/24. She was hospitalized from 07/13 to 07/24 and she had a small bowel obstruction at that time and had surgery on 07/18 and sutures were removed recently by Dr. Anda KraftMarterre. According to the family she actually is not eating well, poor intake in general and also complains of abdominal pain due to sutures. Sutures were removed. Last bowel movement was around seven days ago and she was already getting some stool softeners with no relief. Patient says that her abdominal pain is generalized about 7 to 8/10 in severity, not radiating in type. No aggrevating or relieving. No relieving factors. Patient has nausea and vomiting 2 to 3 times today. Patient dees any fever. Complains of some headache at this time. When she passed out in the bathroom this afternoon she lost consciousness briefly for a minute and then she regained consciousness and she was oriented to time, place, person. Right now her main complaint is only abdominal pain.   PAST MEDICAL HISTORY:  1. History of hypertension.  2. Rheumatoid arthritis. 3. Seizure disorder. 4. Diabetes mellitus.  5. History of diverticulitis in 2007.   PAST  SURGICAL HISTORY:  1. Total abdominal hysterectomy. 2. Recent surgery with segmental ileectomy and primary anastomosis, omentoplasty, ventral hernia repair. Enteroclysis. This was on 07/18 for small bowel obstruction and ventral hernia.   ALLERGIES: Tramadol.   SOCIAL HISTORY: No smoking. No drinking. No drugs.   FAMILY HISTORY: No history of hypertension or diabetes.   MEDICATIONS:  1. Acetaminophen with hydrocodone 5/325 every six hours as needed. 2. Atorvastatin 10 mg daily.  3. Cholecalciferol 1000 units daily.  4. Colace 100 mg p.o. b.i.d.  5. Folic acid 1 mg 2 tablets daily. 6. HCTZ 12.5 mg p.o. daily.  7. Keppra 500 mg IV b.i.d.  8. Metformin 500 mg p.o. b.i.d.   9. Methotrexate 2.5 mg once a week.  10. Norvasc 5 mg daily.  11. Omeprazole 20 mg p.o. daily.  12. Quinapril 20 mg p.o. daily.  13. Prednisone 5 mg p.o. daily.  14. KCL 10 mEq p.o. daily.   REVIEW OF SYSTEMS: CONSTITUTIONAL: Feels weak and fatigued. EYES: No blurred vision. ENT: No tinnitus. No epistaxis. No difficulty swallowing. RESPIRATORY: No cough. Patient has no asthma or dyspnea. CARDIOVASCULAR: No chest pain, no orthopnea, no palpitations. GASTROINTESTINAL: Has nausea and abdominal pain, constipation. Had recent abdominal surgery for small bowel obstruction. GENITOURINARY: No dysuria. ENDOCRINE: No polyuria, nocturia. No increased sweating. No heat or cold intolerance. HEMATOLOGIC: No anemia or easy bruising. INTEGUMENT: No skin rash. MUSCULOSKELETAL: No joint pains. Patient has history of rheumatoid arthritis but no recent flare. NEUROLOGIC: No numbness or weakness. PSYCH: No anxiety or depression.   PHYSICAL EXAMINATION:  VITAL SIGNS: Temperature 98.1, pulse 88, respirations 20,  blood pressure 127/78 at this time but she did have a low blood pressure up to 98/60 before.  GENERAL: She is alert, awake, oriented in moderate distress due to abdominal pain.   HEENT: Head atraumatic, normocephalic. Pupils equally  reacting to light. Extraocular muscles intact. ENT: No tympanic membrane congestion. No turbinate hypertrophy. No oropharyngeal erythema.   NECK: Normal range of motion. No JVD. No carotid bruits.   CARDIOVASCULAR: S1 and S2 regular. No murmurs. PMI not displaced.   LUNGS: Clear to auscultation. No wheeze. No rales.   ABDOMEN: Generalized tenderness present. No rebound tenderness. No guarding. Patient has abdominal sutures removed in the abdomen. Abdomen is not distended. Bowel sounds are diminished. No hernias are observed.   EXTREMITIES: No extremity edema. No cyanosis. No clubbing.   NEUROLOGIC: Patient's cranial nerves II through XII intact. Power 5/5 in upper and lower extremities. Sensations are intact. DTR 2+ bilaterally.   PSYCH: Mood and affect are within normal limits.   LABORATORY, DIAGNOSTIC, AND RADIOLOGICAL DATA: CT of the abdomen showed fecal impaction. Small bowel obstruction that was seen in July has resolved. Prior hernia surgery repair. Minimal hydronephrosis on the right with ureter on right side. No definite obstructing lesion. Very tiny stone cannot be completely excluded. Urological intervention suggested. Air in the bladder likely because of instrumentation. Patient's diverticular disease is noted in the colon and mild streaking is noted in the left colon. Mild diverticulitis cannot be excluded. Appendix is not visualized.   WBC 6.9, hemoglobin 10.9, hematocrit 32.8, platelets 205. Electrolytes: Sodium 138, potassium 4.2, chloride 108, bicarbonate 20, BUN 23, creatinine 1.05, glucose 125. LFTs within normal limits with total protein 2.9, lipase 199. Troponin less than 0.02. Urine yellow colored with leukocyte esterase negative, bacteria none.   ASSESSMENT AND PLAN: Patient is an 81 year old female with:  1. Abdominal pain, nausea and vomiting with evidence of fecal impaction on the CAT scan. Patient had recent bowel surgery and since then she is not eating well with  abdominal pain and also poor p.o. intake. Right now she will be admitted to hospitalist service. Will do stool softeners along with Colace and MiraLax and get surgical consult in view of recent surgery with abdominal pain and nausea.  2. Acute diverticulitis; very, very mild diverticulitis on the left side. Patient has no white count but because of her symptoms we started her prophylactically on antibiotics and also IV fluids and will continue clear liquids as tolerated for abdominal pain. She will be on  pain meds along with Zofran and see how she does.  3. Patient has history of rheumatoid arthritis on chronic prednisone. She was hypotensive when she came secondary to probably not eating well and also not drinking well and taking medications so at this time we will hold her blood pressure medications. Continue IV fluids. She is maintaining her blood pressure with IV fluids alone and dopamine is off, so continue IV fluids, hold the quinapril and also Norvasc and monitor her on off unit telemetry.  4. Patient has seizure disorder. Continue Keppra 500 mg IV b.i.d.  5. Diabetes mellitus type 2 with p.o. intake so continue to hold metformin and use sliding scale with coverage.  6. Possible renal insufficiency. Patient is on chronic steroid therapy and blood pressure was low today so we can use stress dose steroids. If blood pressure is still low we can consider starting her on  pressors but at this time her blood pressure seems to be maintaining on its own so will  be monitoring her on off unit telemetry.   TIME SPENT ON THIS PATIENT: About 60 minutes.   ____________________________ Katha Hamming, MD sk:cms D: 07/23/2012 18:25:11 ET T: 07/24/2012 05:33:14 ET JOB#: [id number]  cc: Katha Hamming, MD, <Dictator> Abbey Chatters. Jorene Guest, MD Katha Hamming MD ELECTRONICALLY SIGNED 08/28/2012 17:47

## 2015-04-10 NOTE — Consult Note (Signed)
PATIENT NAME:  Sherri Stevens, BAZZLE MR#:  [id number] DATE OF BIRTH:  09/11/1930  DATE OF CONSULTATION:  06/28/2012  REFERRING PHYSICIAN:  Dr. 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 The patient's symptoms started the evening prior to admission approximately two hours after eating at [employer]. She lives at home with a grown daughter who brought her to the Emergency Room today when nausea and vomiting did not resolve. In the Emergency Room she underwent a CT which suggested both cholelithiasis with common bile duct dilatation and small bowel obstruction. She has been admitted to the surgical service for decompression and possible surgical intervention and Medicine consult was requested. The patient states that in the last several weeks she has had episodes of chest pain that she has always attributed to her gastritis and esophageal reflux and she does have a history of reflux with hiatal hernia. She has had no prior Cardiology evaluation as far as I can tell. She is a poor historian and the son that is with her is not able to provide any additional information. All information not given by patient was obtained through review of the [facility] data from her February 2012 admission. The patient does have residual dysarthria and right-sided weakness from her stroke. She is not able to live independently due to these issues. She is quite sedated currently and cannot provide more history.   PRIMARY CARE DOCTOR: Dr. Reynolds Bowl    PAST MEDICAL HISTORY:  1. CVA in 2011 with right-sided paralysis and dysarthria, both improved.  2. Seizure disorder secondary to CVA, now on Keppra. 3. Gastroesophageal reflux disease with hiatal hernia and erosive gastropathy by EGD in February 2012. 4. Diabetes mellitus, controlled on oral medications.  5. Rheumatoid arthritis, managed by Dr. Saverio Danker with prednisone and Voltaren.  6. Hyperlipidemia. 7. Cholelithiasis. 8. Hypertension.   CURRENT MEDICATIONS:  1. Atorvastatin 10 mg daily.  2. Bayer aspirin 81 mg daily.  3. Docusate sodium 100 mg twice daily.  4. Folic acid 2 mg daily. 5. Hydrochlorothiazide 12.5 mg daily.  6. Keppra 500 mg twice daily. 7. Metformin 500 mg daily.  8. Methotrexate 2.5 mg 8 tablets once a week on Wednesdays.  9. Omeprazole 20 mg daily.  10. Prednisone 5 mg 1-1/2 tablets daily with meal. 11. Quinapril 20 mg daily.  12. Tylenol with Codeine #3 one tablet every six hours as needed for pain.  13. Vitamin D3 1000 units daily.  14. Voltaren topical 1% gel every six hours as needed for pain.   PAST SURGICAL HISTORY:  1. Remote hysterectomy. 2. History of bowel resection secondary to diverticulitis in 2007. This occurred in [place].   ALLERGIES: She has no known drug allergies.   LAST HOSPITALIZATION: February 2012.   FAMILY HISTORY: Mother died with diverticulitis.   SOCIAL HISTORY: She is a lifelong nondrinker, nonsmoker and lives with grown daughter for supportive care.   REVIEW OF SYSTEMS: Negative for fever, fatigue. She does have right-sided weakness secondary to stroke. No recent changes in weight. She does have chronic blurred vision secondary to  her stroke and possible cataracts. She denies any hearing loss, seasonal rhinitis, postnasal drip, or sinus pain. She eats a regular diet and does not have any trouble swallowing. She denies cough, wheezing, hemoptysis, and dyspnea. She has had no painful respirations. She has had some  episodes of chest pain which she has attributed to reflux. She denies orthopnea, edema, arrhythmia, or dyspnea on exertion. She has had nausea and vomiting with abdominal pain for the last 24 hours per history of present illness. No change in bowel habits. No dysuria or incontinence. She denies polyuria, nocturia, or thyroid problems. No history of anemia or easy bruising. No history of rashes. She does have some chronic low back and knee pain. She does have some occasional joint pain due to her RA. She does have weakness and dysarthria, weakness affecting the right side. She also has a history of seizure and stroke. She has no history of anxiety or insomnia.   PHYSICAL EXAMINATION:   GENERAL: This is a well nourished elderly female who is a bit lethargic but answers appropriately due to use of morphine in the ED for pain control.   ADMISSION VITAL SIGNS: Blood pressure 177/86, pulse 79, respirations 18, temperature 99.6. Repeat blood pressure six hours later 194/101 with pulse of 95.   HEENT: Pupils are equal, round, and reactive to light. Extraocular movements are intact. Sclerae are nonicteric. Oropharynx is dry.   NECK: Supple without lymphadenopathy, JVD, thyromegaly, carotid bruits.   LUNGS: Clear anteriorly to auscultation with good air movement.   CARDIOVASCULAR: Regular rate and rhythm. No murmurs, rubs, or gallops.   ABDOMEN: Soft, tender diffusely with some guarding but no rebound. Bowel sounds are hyperactive.   LYMPH: There is no cervical, axillary, inguinal, or supraclavicular lymphadenopathy.   MUSCULOSKELETAL: She has 4+/5 strength on the left and 4 out of 5 strength on the right. Gait was not tested.   SKIN: Skin is warm and dry without lesions.   NEUROLOGICAL: Cranial nerves are notable for some dysarthria. Deep tendon reflexes are depressed bilaterally. Sensation is intact.   PSYCH: She is alert and oriented but somewhat lethargic.   ADMISSION LABORATORY DATA: Sodium  136, potassium 3.3, chloride 97, bicarb 26, BUN 14, creatinine 0.99. LFTs are notable for a total bilirubin of 1.1. Alkaline phosphatase is normal at 68. AST and ALT are normal. Total protein 8.4. Lipase is normal at 92.  CT scan showed hiatal hernia, gallbladder distention with ductal dilatation to 1 cm, no stones seen, small bowel dilation consistent with obstruction and evidence of prior colonic resection.   ASSESSMENT AND PLAN:  1. Small bowel obstruction. The patient is admitted to the Surgery service with serial x-rays and nasogastric tube ordered.  2. Cholelithiasis with common bile duct dilatation. Concern for obstructing stone is raised and patient again is admitted to the surgical service for observation and empiric treatment with antibiotics versus surgical intervention. From a Medicine standpoint, this patient is adrenally suppressed due to chronic prednisone use and will need stress dose steroids whenever she goes into surgery. Additionally, given her recent episodes of chest pain she needs an echocardiogram done to evaluate her perioperatively for events. She has no known history of coronary artery disease and had a normal echocardiogram during her February admission. However, she is considered moderate risk due to her history of stroke and diabetes and I started her on IV metoprolol.  3. Rheumatoid arthritis. Methotrexate is given once a week and she does not need that currently. She is on  7.5 mg of prednisone daily so I will start her on stress dose steroids to prevent adrenal insufficiency.  4. Diabetes mellitus. Holding metformin and using sliding scale while she is n.p.o.   ESTIMATED TIME OF CARE: 45 minutes.   Thank you for this interesting consult.   ____________________________ Duncan Dulleresa Tullo, MD tt:drc D: 06/28/2012 23:13:09 ET T: 06/29/2012 10:49:41 ET JOB#: [id number]  cc: Duncan Dulleresa Tullo, MD, <Dictator> Abbey ChattersLloyd K. Jorene Guestomstock, MD Duncan DullERESA TULLO MD ELECTRONICALLY SIGNED 06/29/2012  11:46

## 2015-04-10 NOTE — Consult Note (Signed)
See Dictated  Consult note job [id number]  1) Moderate risk for perioperative events given history of DM and CVA.  2) Recent episode of chest pain:  GERD vs angina?  cyle enzymes,  EKG. normal ECHO 2012 .  IV metoprolol  3) Adrenal insufficiency: secondary to chronic use of steroids (7.5 mg daily) for RA .  Will need stress dose sterids at time of surgical intervention.   4) SSI for DM mamangement whille NPO  5) History of seizure secondary to CVA:  on keppra .   Electronic Signatures: Duncan Dullullo, Stephane Junkins (MD)  (Signed on 13-Jul-13 23:18)  Authored  Last Updated: 13-Jul-13 23:18 by Duncan Dullullo, Venecia Mehl (MD)

## 2015-04-10 NOTE — H&P (Signed)
PATIENT NAME:  Sherri Stevens, Sherri Stevens MR#:  [id number] DATE OF BIRTH:  Apr 11, 1930  DATE OF ADMISSION:  06/28/2012  CHIEF COMPLAINT: Abdominal pain.   HISTORY OF PRESENT ILLNESS: The patient began having cramping abdominal pains yesterday. According to her family she vomited all night. She was then brought to the Emergency Department where evaluation was carried out. Initial CT scan of the abdomen suggested some enlargement of the gallbladder and dilatation of the biliary tree but did show what appeared to be a small bowel obstruction. The uterus was surgically absent. There were some decompressed right colon but no other findings other than some scattered diverticula. A gallbladder ultrasound was then performed which confirmed cholelithiasis but no Murphy's sign and no gallbladder wall dilatation. The common bile duct was a little bit large but within normal range for the patient's age. The surgical consultation was then requested because of findings of small bowel obstruction. The hospitalist said they did not want to admit the patient but would consult.   ALLERGIES: The patient has no known drug allergies.   MEDICATIONS: She takes a number of medications which are listed in the medical record.   PAST MEDICAL HISTORY:  1. Diabetes for which she takes oral hypoglycemics.  2. Rheumatoid arthritis.  3. Hypertension. 4. She had a right-sided stroke some three years ago and had a seizure at that time. She has weakness on the right side and did have some problems with speech for a time but this was improved.  5. In 2007 she had a sigmoid colon resection for diverticulitis. 6. Many years ago she had a total abdominal hysterectomy.   SOCIAL HISTORY: She does not smoke or consume alcohol.   REVIEW OF SYSTEMS: HEENT: The patient reports having cataracts in her eyes but no other findings. She does not have problems swallowing. PULMONARY: There are no positive findings. CARDIOVASCULAR: The patient had a stress  test several days ago and is supposed to have a Holter monitor done but we have no other reports for that. GASTROINTESTINAL: Please see the present illness. Renal and urological has no findings. MUSCULOSKELETAL: Has no findings. NEURO: Has the CVA history.   PHYSICAL EXAMINATION: GENERAL: The patient is a rather diminutive 81 year old.   HEENT: Has cataracts but other than that I see no positive findings.   NECK: Supple with no masses.   LUNGS: Lungs are clear.   HEART: There is irregular rhythm with apparently frequent PVCs. I can also hear a rather loud systolic murmur along the left sternal border.   ABDOMEN: There are several lower abdominal scars including a midline and a Pfannenstiel-type scar. There is no gross distention. There are no palpable masses. Bowel sounds are present and a little bit hyperactive. There is no tenderness in the right subcostal region. No palpable mass.   EXTREMITIES: No gross deformities.   NEUROLOGICAL: I did not see any gross deficits at this time.   IMPRESSION:  1. Small bowel obstruction.  2. Cholelithiasis.  3. Diabetes. 4. Rheumatoid arthritis.  5. Hypertension.  6. History of cerebrovascular accident.  7. Cardiac arrhythmia.  8. Systolic murmur suggesting aortic stenosis.     DISPOSITION: The patient is to be admitted as an inpatient. We will place a nasogastric tube and have her on IV fluids. We will obtain hospitalist medical consultation for her myriad of medical problems.    ____________________________ Odella Aquas Andree Elk, MD gla:cms D: 06/28/2012 21:15:45 ET T: 06/29/2012 09:21:02 ET JOB#: [id number]  cc: Berneta Sages L. Andree Elk, MD, <Dictator>  Odella Aquas Ngina Royer MD ELECTRONICALLY SIGNED 07/10/2012 17:22

## 2015-05-04 ENCOUNTER — Ambulatory Visit: Admission: RE | Admit: 2015-05-04 | Payer: Self-pay | Source: Ambulatory Visit

## 2024-03-12 LAB — MICROALBUMIN, URINE: Microalb, Ur: 29.999

## 2024-03-23 ENCOUNTER — Other Ambulatory Visit: Payer: Self-pay
